# Patient Record
Sex: Male | Born: 1965 | Race: Black or African American | Hispanic: No | Marital: Married | Smoking: Light tobacco smoker
Health system: Southern US, Community
[De-identification: ages and names within clinical notes are randomized; demographics above are authoritative.]

## PROBLEM LIST (undated history)

## (undated) DIAGNOSIS — E119 Type 2 diabetes mellitus without complications: Secondary | ICD-10-CM

## (undated) DIAGNOSIS — I10 Essential (primary) hypertension: Secondary | ICD-10-CM

---

## 2001-11-15 ENCOUNTER — Emergency Department (HOSPITAL_COMMUNITY): Admission: EM | Admit: 2001-11-15 | Discharge: 2001-11-15 | Payer: Self-pay | Admitting: Emergency Medicine

## 2015-10-25 ENCOUNTER — Emergency Department (HOSPITAL_COMMUNITY): Payer: Self-pay

## 2015-10-25 ENCOUNTER — Encounter (HOSPITAL_COMMUNITY): Payer: Self-pay | Admitting: Emergency Medicine

## 2015-10-25 ENCOUNTER — Emergency Department (HOSPITAL_COMMUNITY)
Admission: EM | Admit: 2015-10-25 | Discharge: 2015-10-26 | Disposition: A | Discharge status: Home / Self Care | Payer: Self-pay | Attending: Emergency Medicine | Admitting: Emergency Medicine

## 2015-10-25 DIAGNOSIS — Z7984 Long term (current) use of oral hypoglycemic drugs: Secondary | ICD-10-CM | POA: Insufficient documentation

## 2015-10-25 DIAGNOSIS — F17210 Nicotine dependence, cigarettes, uncomplicated: Secondary | ICD-10-CM | POA: Insufficient documentation

## 2015-10-25 DIAGNOSIS — E860 Dehydration: Secondary | ICD-10-CM | POA: Insufficient documentation

## 2015-10-25 DIAGNOSIS — E119 Type 2 diabetes mellitus without complications: Secondary | ICD-10-CM | POA: Insufficient documentation

## 2015-10-25 DIAGNOSIS — I10 Essential (primary) hypertension: Secondary | ICD-10-CM | POA: Insufficient documentation

## 2015-10-25 DIAGNOSIS — R55 Syncope and collapse: Secondary | ICD-10-CM | POA: Insufficient documentation

## 2015-10-25 HISTORY — DX: Type 2 diabetes mellitus without complications: E11.9

## 2015-10-25 HISTORY — DX: Essential (primary) hypertension: I10

## 2015-10-25 LAB — URINALYSIS, ROUTINE W REFLEX MICROSCOPIC
Bilirubin Urine: NEGATIVE
Glucose, UA: NEGATIVE mg/dL
Ketones, ur: 15 mg/dL — AB
LEUKOCYTES UA: NEGATIVE
NITRITE: NEGATIVE
PH: 5 (ref 5.0–8.0)
Protein, ur: NEGATIVE mg/dL
SPECIFIC GRAVITY, URINE: 1.015 (ref 1.005–1.030)

## 2015-10-25 LAB — CBC
HEMATOCRIT: 38.8 % — AB (ref 39.0–52.0)
HEMOGLOBIN: 12.1 g/dL — AB (ref 13.0–17.0)
MCH: 28.5 pg (ref 26.0–34.0)
MCHC: 31.2 g/dL (ref 30.0–36.0)
MCV: 91.5 fL (ref 78.0–100.0)
Platelets: 200 10*3/uL (ref 150–400)
RBC: 4.24 MIL/uL (ref 4.22–5.81)
RDW: 14.1 % (ref 11.5–15.5)
WBC: 5.6 10*3/uL (ref 4.0–10.5)

## 2015-10-25 LAB — BASIC METABOLIC PANEL
ANION GAP: 12 (ref 5–15)
BUN: 5 mg/dL — ABNORMAL LOW (ref 6–20)
CO2: 20 mmol/L — ABNORMAL LOW (ref 22–32)
Calcium: 8.1 mg/dL — ABNORMAL LOW (ref 8.9–10.3)
Chloride: 105 mmol/L (ref 101–111)
Creatinine, Ser: 0.79 mg/dL (ref 0.61–1.24)
Glucose, Bld: 105 mg/dL — ABNORMAL HIGH (ref 65–99)
POTASSIUM: 3.3 mmol/L — AB (ref 3.5–5.1)
SODIUM: 137 mmol/L (ref 135–145)

## 2015-10-25 LAB — URINE MICROSCOPIC-ADD ON
Squamous Epithelial / LPF: NONE SEEN
WBC UA: NONE SEEN WBC/hpf (ref 0–5)

## 2015-10-25 LAB — I-STAT TROPONIN, ED: TROPONIN I, POC: 0 ng/mL (ref 0.00–0.08)

## 2015-10-25 LAB — CBG MONITORING, ED: GLUCOSE-CAPILLARY: 92 mg/dL (ref 65–99)

## 2015-10-25 MED ORDER — TETRACAINE HCL 0.5 % OP SOLN
2.0000 [drp] | Freq: Once | OPHTHALMIC | Status: AC
  Administered 2015-10-26: 2 [drp] via OPHTHALMIC
  Filled 2015-10-25: qty 2

## 2015-10-25 MED ORDER — SODIUM CHLORIDE 0.9 % IV BOLUS (SEPSIS)
1000.0000 mL | Freq: Once | INTRAVENOUS | Status: AC
  Administered 2015-10-25: 1000 mL via INTRAVENOUS

## 2015-10-25 MED ORDER — SODIUM CHLORIDE 0.9 % IV BOLUS (SEPSIS)
1000.0000 mL | Freq: Once | INTRAVENOUS | Status: AC
  Administered 2015-10-26: 1000 mL via INTRAVENOUS

## 2015-10-25 NOTE — ED Notes (Signed)
Pt given urinal and instructed to provide urine sample 

## 2015-10-25 NOTE — ED Notes (Signed)
Patient transported to X-ray 

## 2015-10-25 NOTE — ED Notes (Signed)
Pt [place] roadside with [facility] for syncopal episode that was witnessed by bystanders; EMS reported that patient has been out in heat all day with minimal to eat/drink except 2 beers; pt was found to be hot and diaphoretic, was lethargic and disoriented x4; pt placed in cool ambulance and therapy initiated and pt became more alert and oriented; EMS reports pt recently released from prison and dx with DM and given new Rx for metformin- needs education

## 2015-10-25 NOTE — ED Notes (Signed)
Patient transported to CT 

## 2015-10-25 NOTE — ED Provider Notes (Signed)
CSN: [id number]     Arrival date & time 10/25/15  2142 History   First MD Initiated Contact with Patient 10/25/15 2204     Chief Complaint  Patient presents with  . Loss of Consciousness  . Heat Exposure  . Dizziness  . Blurred Vision  . Nausea     (Consider location/radiation/quality/duration/timing/severity/associated sxs/prior Treatment) The history is provided by the patient.     Pt with recent hx DM on metformin, released from prison two days ago brought in by EMS following episode of syncope.  Pt states he has felt generalized weakness and blurry vision for two days following being released from prison.  States today he had two small cans of beer and was walking home from the store when he began to feel like he was going to pass out, he stopped walking but could not stop it.  Denies any injuries.  Has chronic problems on the right side of his body, pain in his right eye, right lower back pain, numbness in his right foot.    Past Medical History  Diagnosis Date  . Diabetes mellitus without complication (HCC)   . Hypertension    History reviewed. No pertinent past surgical history. History reviewed. No pertinent family history. Social History  Substance Use Topics  . Smoking status: Light Tobacco Smoker    Types: Cigarettes  . Smokeless tobacco: None  . Alcohol Use: Yes     Comment: 2 cans of beer today    Review of Systems  All other systems reviewed and are negative.     Allergies  Shellfish allergy  Home Medications   Prior to Admission medications   Medication Sig Start Date End Date Taking? Authorizing Provider  metFORMIN (GLUCOPHAGE) 500 MG tablet Take 500 mg by mouth 2 (two) times daily with a meal.   Yes Historical Provider, MD   BP 134/76 mmHg  Pulse 90  Temp(Src) 97.4 F (36.3 C) (Oral)  Resp 16  SpO2 97% Physical Exam  Constitutional: He is oriented to person, place, and time. He appears well-developed and well-nourished. No distress.  HENT:   Head: Normocephalic and atraumatic.  Mouth/Throat: Mucous membranes are dry.  Eyes: EOM are normal. Pupils are equal, round, and reactive to light. Right eye exhibits no discharge. Left eye exhibits no discharge.  Right eye pressure 19  Neck: Neck supple.  Cardiovascular: Normal rate and regular rhythm.   Pulmonary/Chest: Effort normal and breath sounds normal. No respiratory distress. He has no wheezes. He has no rales.  Abdominal: Soft. He exhibits no distension. There is no rebound and no guarding.  Obese.  Mild lower abdominal tenderness  Neurological: He is alert and oriented to person, place, and time. He exhibits normal muscle tone. GCS eye subscore is 4. GCS verbal subscore is 5. GCS motor subscore is 6.  CN II-XII intact, EOMs intact, no pronator drift, grip strengths equal bilaterally; strength 5/5 in all extremities, sensation intact in upper extremities and left lower extremity, right lower extremity only to the level of the ankle. Pt denies sensation in right foot; finger to nose is slow but normal.    Skin: He is not diaphoretic.  Nursing note and vitals reviewed.   ED Course  Procedures (including critical care time) Labs Review Labs Reviewed  BASIC METABOLIC PANEL - Abnormal; Notable for the following:    Potassium 3.3 (*)    CO2 20 (*)    Glucose, Bld 105 (*)    BUN 5 (*)  Calcium 8.1 (*)    All other components within normal limits  CBC - Abnormal; Notable for the following:    Hemoglobin 12.1 (*)    HCT 38.8 (*)    All other components within normal limits  URINALYSIS, ROUTINE W REFLEX MICROSCOPIC (NOT [facility]) - Abnormal; Notable for the following:    Hgb urine dipstick TRACE (*)    Ketones, ur 15 (*)    All other components within normal limits  URINE MICROSCOPIC-ADD ON - Abnormal; Notable for the following:    Bacteria, UA RARE (*)    Casts HYALINE CASTS (*)    All other components within normal limits  CBG MONITORING, ED  I-STAT TROPOININ, ED  Rosezena SensorI-STAT  TROPOININ, ED    Imaging Review Dg Chest 2 View  10/25/2015  CLINICAL DATA:  50 year old male with right-sided weakness and syncope EXAM: CHEST  2 VIEW COMPARISON:  None. FINDINGS: The heart size and mediastinal contours are within normal limits. Both lungs are clear. The visualized skeletal structures are unremarkable. IMPRESSION: No active cardiopulmonary disease. Electronically Signed   By: Elgie CollardArash  Radparvar M.D.   On: 10/25/2015 22:59   Ct Head Wo Contrast  10/26/2015  CLINICAL DATA:  50 year old male with weakness and syncope EXAM: CT HEAD WITHOUT CONTRAST TECHNIQUE: Contiguous axial images were obtained from the base of the skull through the vertex without intravenous contrast. COMPARISON:  None. FINDINGS: The ventricles and the sulci are appropriate in size for the patient's age. There is no intracranial hemorrhage. No midline shift or mass effect identified. The gray-white matter differentiation is preserved. The visualized paranasal sinuses and mastoid air cells are well aerated. The calvarium is intact. IMPRESSION: No acute intracranial pathology. Electronically Signed   By: Elgie CollardArash  Radparvar M.D.   On: 10/26/2015 00:17   I have personally reviewed and evaluated these images and lab results as part of my medical decision-making.   EKG Interpretation   Date/Time:  Friday October 25 2015 23:33:24 EDT Ventricular Rate:  77 PR Interval:    QRS Duration: 104 QT Interval:  429 QTC Calculation: 486 R Axis:   33 Text Interpretation:  Sinus rhythm Borderline T wave abnormalities  Borderline ST elevation, anterior leads Borderline prolonged QT interval  No significant change since last tracing Confirmed by NGUYEN, Manisha Cancel  ([id number]) on 10/25/2015 11:56:50 PM       12:31 AM Pt reports he is feeling much better.  He looks much improved, is more energetic, talkative.    MDM   Final diagnoses:  Syncope, unspecified syncope type  Dehydration   Afebrile, nontoxic patient with 2 days of  generalized weakness with episode of syncope after drinking ETOH and then walking in the heat.  Had prodrome of lightheadedness.  Pt dehydrated clinically, HR increases significantly with orthostatic vital sign testing.  Great improvement with IVF and PO food/beverage.  Workup reassuring.  Discussed pt with Dr Cyndie ChimeNguyen.   D/C home with PCP resources.  Discussed result, findings, treatment, and follow up  with patient.  Pt given return precautions.  Pt verbalizes understanding and agrees with plan.         Trixie Dredgemily Milam Allbaugh, PA-C 10/26/15 [id number]  Leta BaptistEmily Roe Nguyen, MD 10/31/15 [phone]

## 2015-10-26 LAB — I-STAT TROPONIN, ED: TROPONIN I, POC: 0.01 ng/mL (ref 0.00–0.08)

## 2015-10-26 MED ORDER — SODIUM CHLORIDE 0.9 % IV BOLUS (SEPSIS)
1000.0000 mL | Freq: Once | INTRAVENOUS | Status: AC
  Administered 2015-10-26: 1000 mL via INTRAVENOUS

## 2015-10-26 NOTE — ED Notes (Signed)
Pt given orange juice.

## 2015-10-26 NOTE — ED Notes (Signed)
Placed back on 2L [place], desating in his sleep to mid 80s

## 2015-10-26 NOTE — ED Notes (Signed)
The pt is c/o being cold blanket given  No other complaints

## 2015-10-26 NOTE — Discharge Instructions (Signed)
Read the information below.  You may return to the Emergency Department at any time for worsening condition or any new symptoms that concern you.   Dehydration, Adult Dehydration is a condition in which you do not have enough fluid or water in your body. It happens when you take in less fluid than you lose. Vital organs such as the kidneys, brain, and heart cannot function without a proper amount of fluids. Any loss of fluids from the body can cause dehydration.  Dehydration can range from mild to severe. This condition should be treated right away to help prevent it from becoming severe. CAUSES  This condition may be caused by:  Vomiting.  Diarrhea.  Excessive sweating, such as when exercising in hot or humid weather.  Not drinking enough fluid during strenuous exercise or during an illness.  Excessive urine output.  Fever.  Certain medicines. RISK FACTORS This condition is more likely to develop in:  People who are taking certain medicines that cause the body to lose excess fluid (diuretics).   People who have a chronic illness, such as diabetes, that may increase urination.  Older adults.   People who live at high altitudes.   People who participate in endurance sports.  SYMPTOMS  Mild Dehydration  Thirst.  Dry lips.  Slightly dry mouth.  Dry, warm skin. Moderate Dehydration  Very dry mouth.   Muscle cramps.   Dark urine and decreased urine production.   Decreased tear production.   Headache.   Light-headedness, especially when you stand up from a sitting position.  Severe Dehydration  Changes in skin.   Cold and clammy skin.   Skin does not spring back quickly when lightly pinched and released.   Changes in body fluids.   Extreme thirst.   No tears.   Not able to sweat when body temperature is high, such as in hot weather.   Minimal urine production.   Changes in vital signs.   Rapid, weak pulse (more than 100 beats  per minute when you are sitting still).   Rapid breathing.   Low blood pressure.   Other changes.   Sunken eyes.   Cold hands and feet.   Confusion.  Lethargy and difficulty being awakened.  Fainting (syncope).   Short-term weight loss.   Unconsciousness. DIAGNOSIS  This condition may be diagnosed based on your symptoms. You may also have tests to determine how severe your dehydration is. These tests may include:   Urine tests.   Blood tests.  TREATMENT  Treatment for this condition depends on the severity. Mild or moderate dehydration can often be treated at home. Treatment should be started right away. Do not wait until dehydration becomes severe. Severe dehydration needs to be treated at the hospital. Treatment for Mild Dehydration  Drinking plenty of water to replace the fluid you have lost.   Replacing minerals in your blood (electrolytes) that you may have lost.  Treatment for Moderate Dehydration  Consuming oral rehydration solution (ORS). Treatment for Severe Dehydration  Receiving fluid through an IV tube.   Receiving electrolyte solution through a feeding tube that is passed through your nose and into your stomach (nasogastric tube or NG tube).  Correcting any abnormalities in electrolytes. HOME CARE INSTRUCTIONS   Drink enough fluid to keep your urine clear or pale yellow.   Drink water or fluid slowly by taking small sips. You can also try sucking on ice cubes.  Have food or beverages that contain electrolytes. Examples include bananas and  sports drinks.  Take over-the-counter and prescription medicines only as told by your health care provider.   Prepare ORS according to the manufacturer's instructions. Take sips of ORS every 5 minutes until your urine returns to normal.  If you have vomiting or diarrhea, continue to try to drink water, ORS, or both.   If you have diarrhea, avoid:   Beverages that contain caffeine.   Fruit  juice.   Milk.   Carbonated soft drinks.  Do not take salt tablets. This can lead to the condition of having too much sodium in your body (hypernatremia).  SEEK MEDICAL CARE IF:  You cannot eat or drink without vomiting.  You have had moderate diarrhea during a period of more than 24 hours.  You have a fever. SEEK IMMEDIATE MEDICAL CARE IF:   You have extreme thirst.  You have severe diarrhea.  You have not urinated in 6-8 hours, or you have urinated only a small amount of very dark urine.  You have shriveled skin.  You are dizzy, confused, or both.   This information is not intended to replace advice given to you by your health care provider. Make sure you discuss any questions you have with your health care provider.   Document Released: 04/13/2005 Document Revised: 01/02/2015 Document Reviewed: 08/29/2014 Elsevier Interactive Patient Education 2016 [employer].  Syncope Syncope is a medical term for fainting or passing out. This means you lose consciousness and drop to the ground. People are generally unconscious for less than 5 minutes. You may have some muscle twitches for up to 15 seconds before waking up and returning to normal. Syncope occurs more often in older adults, but it can happen to anyone. While most causes of syncope are not dangerous, syncope can be a sign of a serious medical problem. It is important to seek medical care.  CAUSES  Syncope is caused by a sudden drop in blood flow to the brain. The specific cause is often not determined. Factors that can bring on syncope include:  Taking medicines that lower blood pressure.  Sudden changes in posture, such as standing up quickly.  Taking more medicine than prescribed.  Standing in one place for too long.  Seizure disorders.  Dehydration and excessive exposure to heat.  Low blood sugar (hypoglycemia).  Straining to have a bowel movement.  Heart disease, irregular heartbeat, or other  circulatory problems.  Fear, emotional distress, seeing blood, or severe pain. SYMPTOMS  Right before fainting, you may:  Feel dizzy or light-headed.  Feel nauseous.  See all white or all black in your field of vision.  Have cold, clammy skin. DIAGNOSIS  Your health care provider will ask about your symptoms, perform a physical exam, and perform an electrocardiogram (ECG) to record the electrical activity of your heart. Your health care provider may also perform other heart or blood tests to determine the cause of your syncope which may include:  Transthoracic echocardiogram (TTE). During echocardiography, sound waves are used to evaluate how blood flows through your heart.  Transesophageal echocardiogram (TEE).  Cardiac monitoring. This allows your health care provider to monitor your heart rate and rhythm in real time.  Holter monitor. This is a portable device that records your heartbeat and can help diagnose heart arrhythmias. It allows your health care provider to track your heart activity for several days, if needed.  Stress tests by exercise or by giving medicine that makes the heart beat faster. TREATMENT  In most cases, no treatment is needed.  Depending on the cause of your syncope, your health care provider may recommend changing or stopping some of your medicines. HOME CARE INSTRUCTIONS  Have someone stay with you until you feel stable.  Do not drive, use machinery, or play sports until your health care provider says it is okay.  Keep all follow-up appointments as directed by your health care provider.  Lie down right away if you start feeling like you might faint. Breathe deeply and steadily. Wait until all the symptoms have passed.  Drink enough fluids to keep your urine clear or pale yellow.  If you are taking blood pressure or heart medicine, get up slowly and take several minutes to sit and then stand. This can reduce dizziness. SEEK IMMEDIATE MEDICAL CARE IF:     You have a severe headache.  You have unusual pain in the chest, abdomen, or back.  You are bleeding from your mouth or rectum, or you have black or tarry stool.  You have an irregular or very fast heartbeat.  You have pain with breathing.  You have repeated fainting or seizure-like jerking during an episode.  You faint when sitting or lying down.  You have confusion.  You have trouble walking.  You have severe weakness.  You have vision problems. If you fainted, call your local emergency services (911 in [place] Do not drive yourself to the hospital.    This information is not intended to replace advice given to you by your health care provider. Make sure you discuss any questions you have with your health care provider.   Document Released: 04/13/2005 Document Revised: 08/28/2014 Document Reviewed: 06/12/2011 Elsevier Interactive Patient Education 2016 [employer]   [employer] Resource Guide Financial Assistance The United Ways 211 is a great source of information about community services available.  Access by dialing 2-1-1 from anywhere in [place], or by website -  PooledIncome.plwww.nc211.org.   Other Local Resources (Updated 04/2015)  Financial Assistance   Services    Phone Number and Address  [facility]  Low-cost medical care - 1st and 3rd Saturday of every month  Must not qualify for public or private insurance and must have limited income [phone] [address] [place], [place]    [place] [employer] Social Services  Child care  Emergency assistance for housing and Kimberly[employer]Clarkutilities  Food stamps  Medicaid [phone] [address] [place], [place] [address]   [facility]  Low-cost medical care for children, communicable diseases, sexually-transmitted diseases, immunizations, maternity care, womens health and family planning [phone] [place], [place] [address]   [facility]   Medication assistance for [facility] [place] residents  Must meet income requirements [phone] [address] [place], [place].    [facility]  Child care  Emergency assistance for housing and Kimberly-Clarkutilities  Food stamps  Medicaid [phone] [address] [place], [place] [address]  [facility]   Low-cost medical care,   Monday through Friday, 9 am to 6 pm.   Accepts Medicare/Medicaid, and self-pay [phone] [address] [place], [place] [address]  [facility]  Low-cost medical care - Monday through Friday, 8:30 am - 5:30 pm  Accepts Medicaid and self-pay [phone] [address] [place], [place] [address]   [facility]  Primary medical care, including for those with sickle cell disease  Accepts Medicare, Medicaid, insurance and self-pay [phone] [address] [place], [place]  [facility]   Primary medical care  Accepts Medicare, [place], insurance and self-pay [phone] [address] [place], [place] [address]   [facility] Department of Social Services  Child care  Emergency assistance for housing and Kimberly-Clarkutilities  Food stamps  Medicaid [phone] [address] [place], [place] [address]  [facility] and [employer]Human Services  Child care  Emergency assistance for housing and Kimberly-Clarkutilities  Food stamps  Medicaid [phone] [address] [place], [place] [address]   [facility]  Medication assistance for [facility] residents with no insurance only  Must have a primary care doctor [phone] [address] [place], [place]  [facility]   Primary medical care  [place] Medicare, [place], insurance  [phone] [address] [place], [place]  [facility]   Medication assistance [phone]  [facility]   Primary medical care  Accepts Medicare, [place], insurance and self-pay [phone] [address] [place], [place] [address]  [facility]   Primary medical care  Accepts Medicare, [place], insurance and self-pay [phone] [address] [place], [place] [address]  [facility]  For [place] residents between the ages of 6418 and 2864 who do not have any form of health insurance, Medicare, [place], or [place] benefits.  Services are provided free of charge to uninsured patients who fall within federal poverty guidelines.    Hours: Tuesdays and Thursdays, 4:15 - 8 pm [phone] [address], [address] [place], [place] [address]  [facility]     Primary medical care  Dental care  Nutritional counseling  Pharmacy  Accepts Medicaid, Medicare, most insurance.  Fees are adjusted based on ability to pay.   [phone] [facility] [address] [place], [place]  [phone] [facility] [address] [place], [place]  [phone] [facility] [place], [place]  [phone] [facility], [address] [place], [place]  [phone] [facility] [address] [place], [place]  [facility]  Womens health and family planning [phone] [address] [place], [place]  [facility] Department of Social Services  Child care  Emergency assistance for housing and Kimberly-Clarkutilities  Food stamps  Medicaid [phone] [address], [place], [place] [address]   [facility]    Ages 3018 and older  Hours: Mondays and Thursdays, 7:00 am - 9:00 am Patients are seen on a first come, first served basis. [phone], [phone] [place], [place]  [facility]  Division of Social Services  Child care  Emergency assistance for housing and Kimberly-Clarkutilities  Food stamps  Medicaid [phone] [address] [place], [place] [address]  [facility] [employer]  Medication assistance  Rental assistance  Food pantry  Medication assistance  Housing assistance  Emergency food distribution  Utility assistance [phone] [address] [place], [place]  [phone]  [address] [place], [place] [address] Hours: Tuesdays and Thursdays from 9am - 12 noon by appointment only  [phone] [address] [place], [place] [address]  [facility] - Lanae Boastlara F. Gunn   Accepts private insurance, [place]icare, and [place]icaid.  Payment is based on a sliding scale for those without insurance.  Hours: Mondays, Tuesdays and Thursdays, 8:30 am - 5:30 pm.   [phone] [address] [place], [place]  [facility] - Family Medicine at [facility], [place], and [place].  Payment is based on a sliding scale for those without insurance. [phone] [address] [place], [place]  [facility] - Pediatrics at E. Scientist, research (physical sciences), [employer], and [place].  Payment is based on a sliding scale for those without insurance [phone] [address], [employer], [place]  [facility] - Pediatrics at [employer], [place], and [place].  Payment is based on a sliding scale for those without insurance. [phone] [address] [place], [place]  [facility] - Pediatrics at [facility], [place], and [place].  Payment is based on a sliding scale for those without insurance. [phone], [phone] [place], [place].    [facility]  Maternity care.  Accepts Medicaid and self-pay. [phone] [address] [place], [place]

## 2018-03-14 IMAGING — CT CT HEAD W/O CM
3 series · 15 of 47 positions shown, 18 images · non-contrast
Comparison: None.

CLINICAL DATA: 50-year-old male with weakness and syncope

EXAM:
CT HEAD WITHOUT CONTRAST
TECHNIQUE: Contiguous axial images were obtained from the base of the skull
through the vertex without intravenous contrast.

[Series 2: head 5.0 h30s · axial · 0.46mm/px · z∈[-155,-15]mm · 9 of 34 slices shown, 12 images]
[im 3/34  brain]
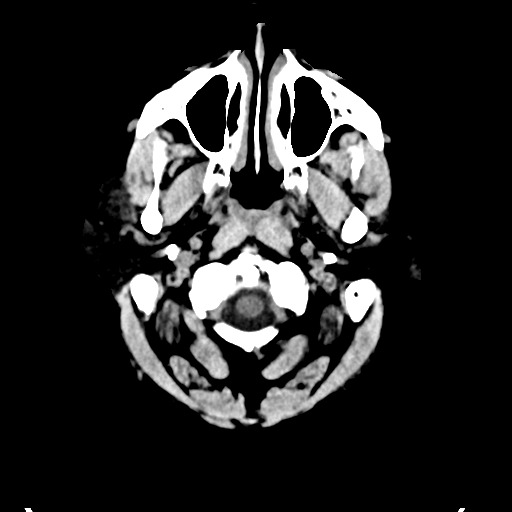
[im 3/34  bone]
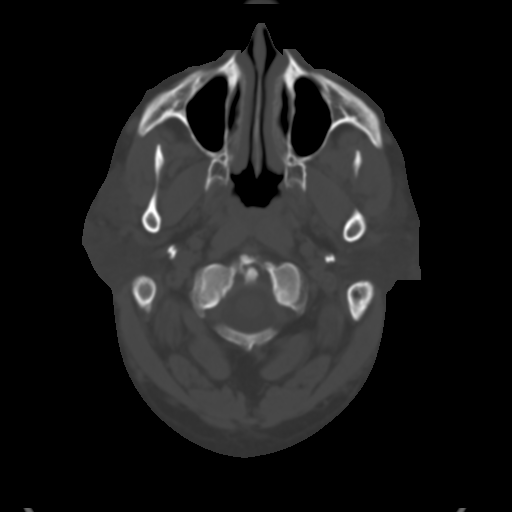
[im 6/34  brain]
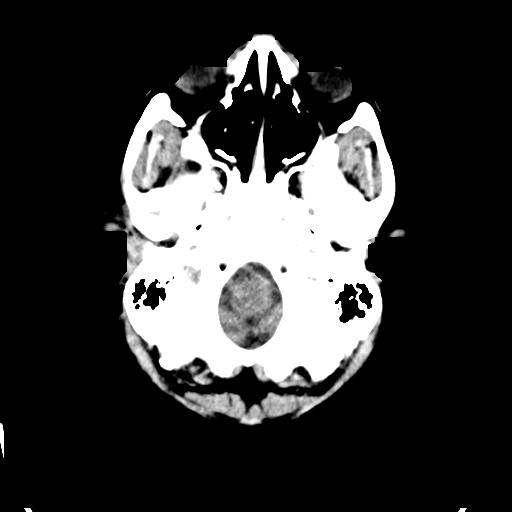
[im 10/34  brain]
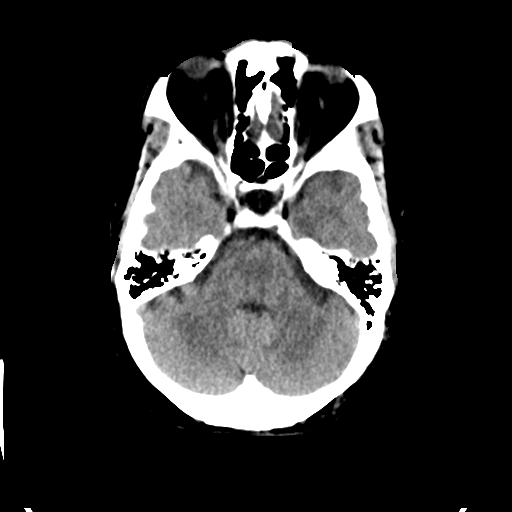
[im 13/34  brain]
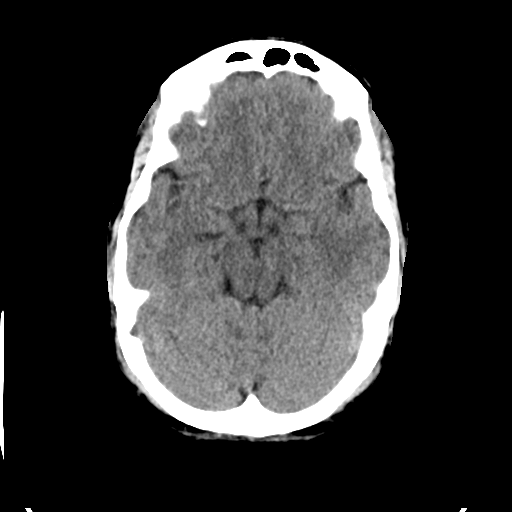
[im 18/34  brain]
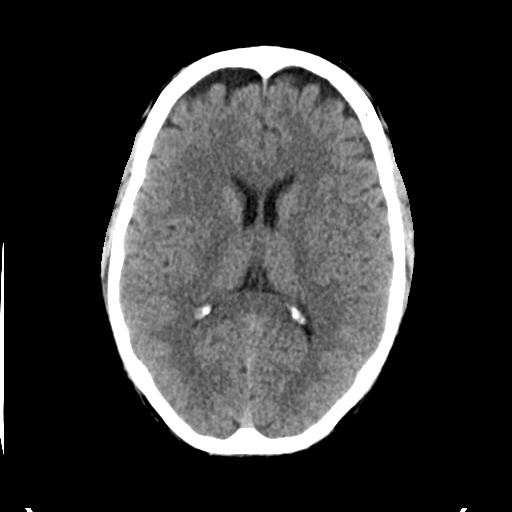
[im 18/34  bone]
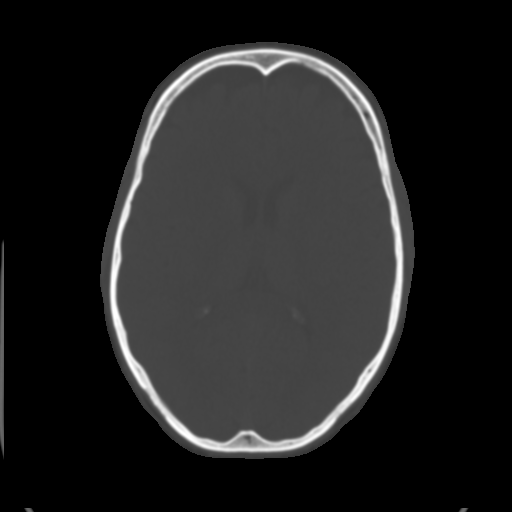
[im 21/34  brain]
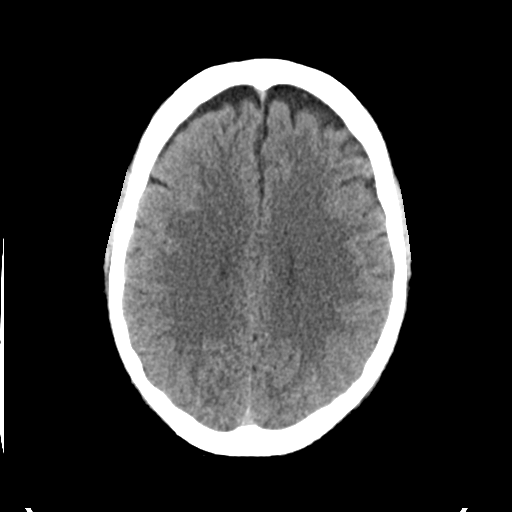
[im 24/34  brain]
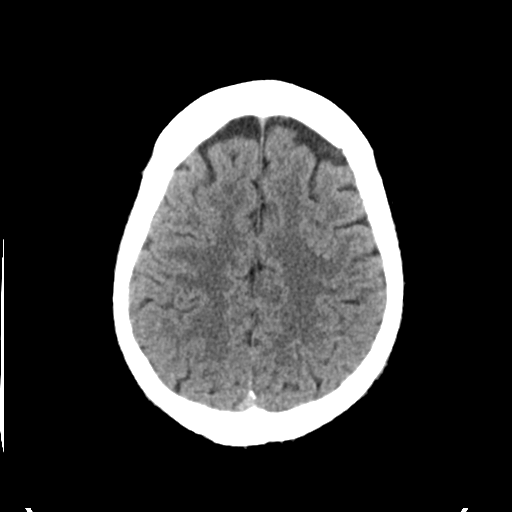
[im 28/34  brain]
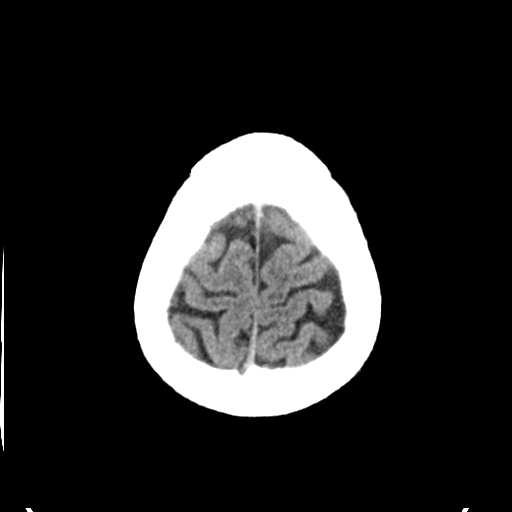
[im 31/34  brain]
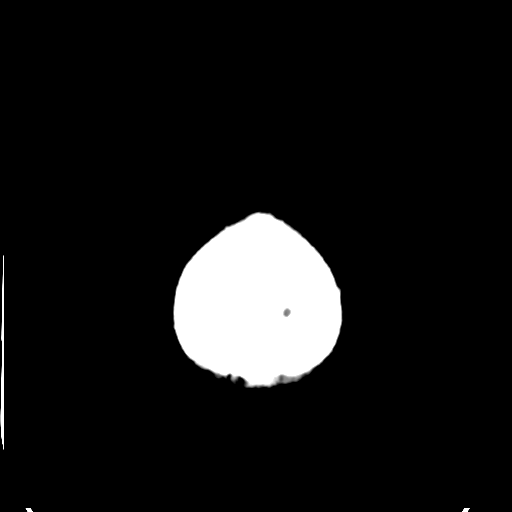
[im 31/34  bone]
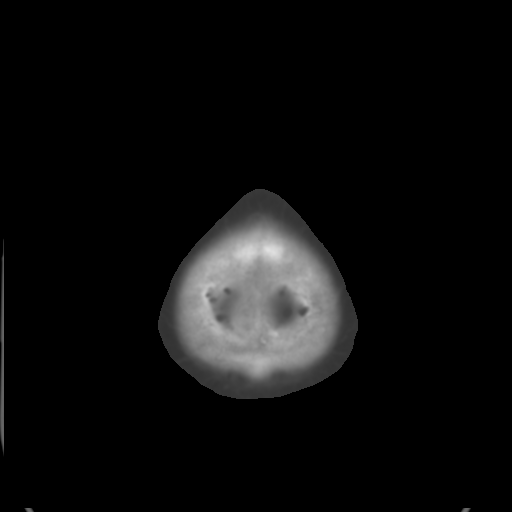

[Series 4: head 3.0 mpr · coronal · 0.30mm/px · 3 of 68 slices shown (1 of 2)]
[im 23/68  brain]
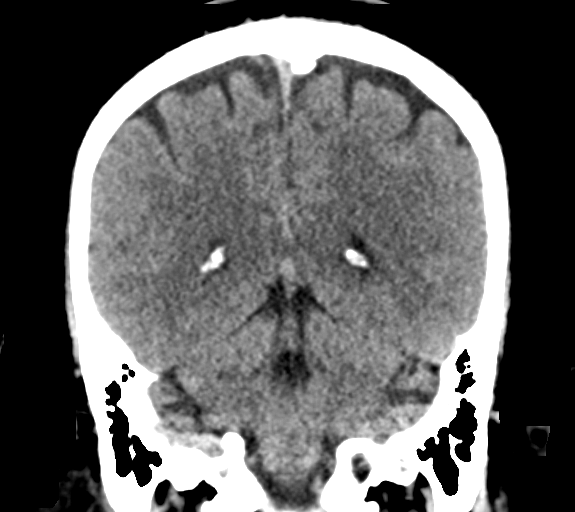
[im 30/68  brain]
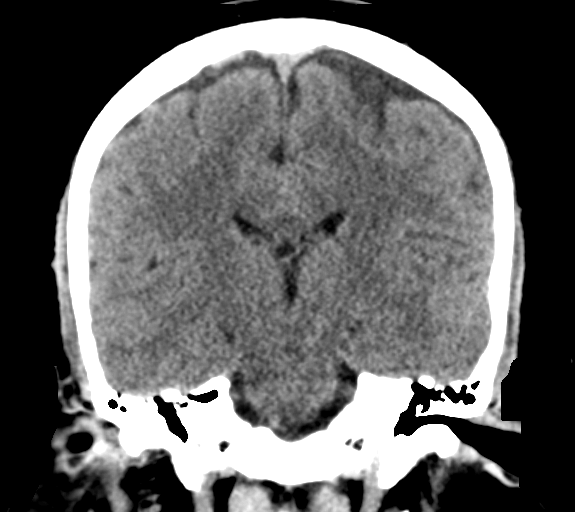
[im 38/68  brain]
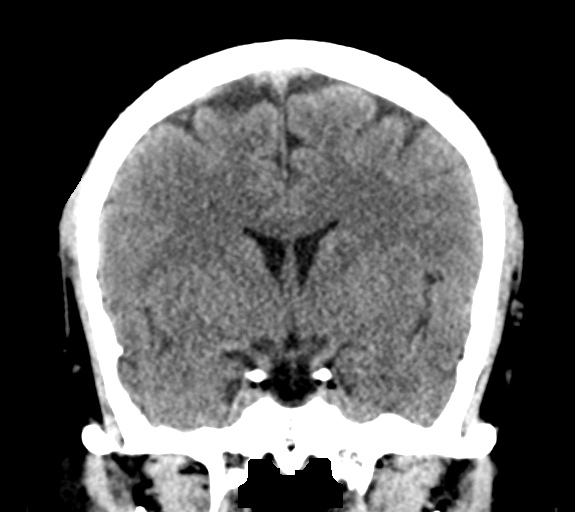

[Series 5: head 3.0 mpr · sagittal · 0.34mm/px · 3 of 58 slices shown (2 of 2)]
[im 20/58  brain]
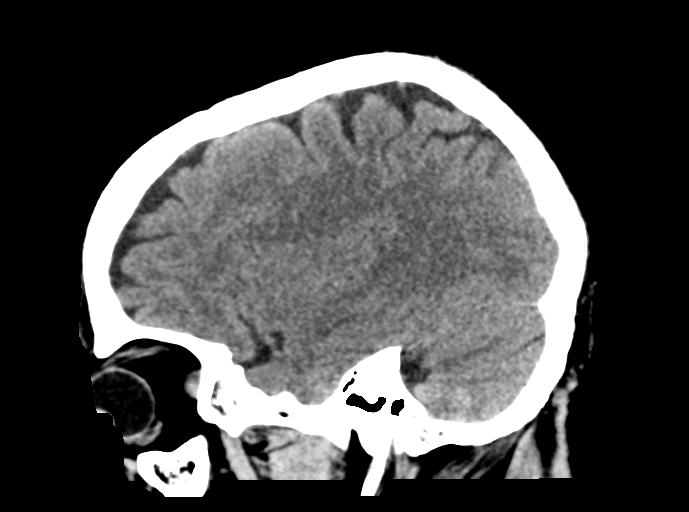
[im 29/58  brain]
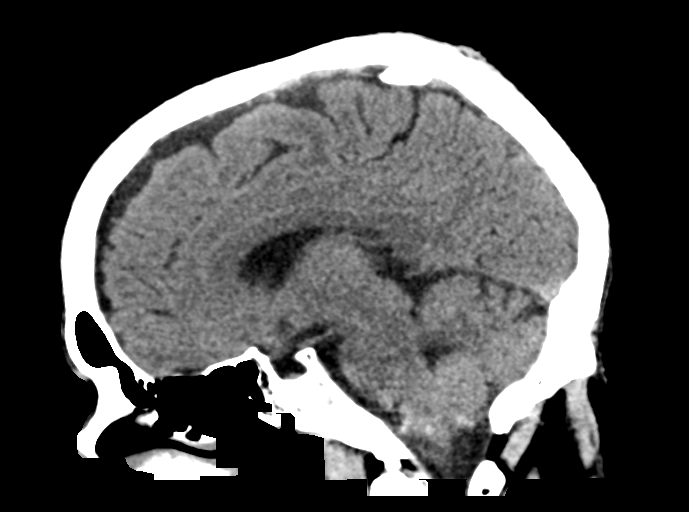
[im 39/58  brain]
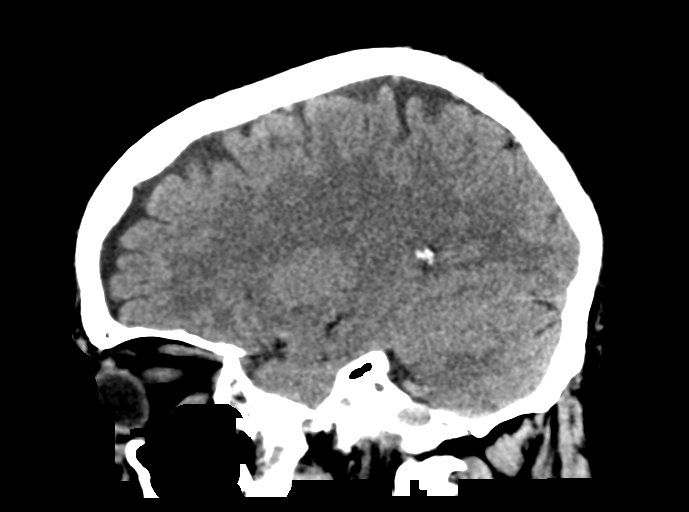

[15 of 47 positions shown; findings below may reference images not displayed]

FINDINGS: The ventricles and the sulci are appropriate in size for the
patient's age. There is no intracranial hemorrhage. No midline shift
or mass effect identified. The gray-white matter differentiation is
preserved.

The visualized paranasal sinuses and mastoid air cells are well
aerated. The calvarium is intact.
IMPRESSION: No acute intracranial pathology.

## 2022-05-31 ENCOUNTER — Ambulatory Visit (HOSPITAL_COMMUNITY)
Admission: EM | Admit: 2022-05-31 | Discharge: 2022-06-01 | Disposition: A | Discharge status: Home / Self Care | Payer: Medicaid Other | Attending: Nurse Practitioner | Admitting: Nurse Practitioner

## 2022-05-31 DIAGNOSIS — Z79899 Other long term (current) drug therapy: Secondary | ICD-10-CM | POA: Diagnosis not present

## 2022-05-31 DIAGNOSIS — Z556 Problems related to health literacy: Secondary | ICD-10-CM | POA: Insufficient documentation

## 2022-05-31 DIAGNOSIS — R454 Irritability and anger: Secondary | ICD-10-CM | POA: Insufficient documentation

## 2022-05-31 DIAGNOSIS — F419 Anxiety disorder, unspecified: Secondary | ICD-10-CM | POA: Insufficient documentation

## 2022-05-31 DIAGNOSIS — Z7984 Long term (current) use of oral hypoglycemic drugs: Secondary | ICD-10-CM | POA: Insufficient documentation

## 2022-05-31 DIAGNOSIS — Z560 Unemployment, unspecified: Secondary | ICD-10-CM | POA: Insufficient documentation

## 2022-05-31 DIAGNOSIS — F331 Major depressive disorder, recurrent, moderate: Secondary | ICD-10-CM | POA: Diagnosis not present

## 2022-05-31 DIAGNOSIS — Z5986 Financial insecurity: Secondary | ICD-10-CM | POA: Diagnosis not present

## 2022-05-31 DIAGNOSIS — Z1152 Encounter for screening for COVID-19: Secondary | ICD-10-CM | POA: Diagnosis not present

## 2022-05-31 DIAGNOSIS — E119 Type 2 diabetes mellitus without complications: Secondary | ICD-10-CM | POA: Diagnosis not present

## 2022-05-31 DIAGNOSIS — R441 Visual hallucinations: Secondary | ICD-10-CM | POA: Insufficient documentation

## 2022-05-31 LAB — RESP PANEL BY RT-PCR (RSV, FLU A&B, COVID)  RVPGX2
Influenza A by PCR: NEGATIVE
Influenza B by PCR: NEGATIVE
Resp Syncytial Virus by PCR: NEGATIVE
SARS Coronavirus 2 by RT PCR: NEGATIVE

## 2022-05-31 LAB — CBC WITH DIFFERENTIAL/PLATELET
Abs Immature Granulocytes: 0.01 10*3/uL (ref 0.00–0.07)
Basophils Absolute: 0 10*3/uL (ref 0.0–0.1)
Basophils Relative: 0 %
Eosinophils Absolute: 0 10*3/uL (ref 0.0–0.5)
Eosinophils Relative: 1 %
HCT: 42 % (ref 39.0–52.0)
Hemoglobin: 13.7 g/dL (ref 13.0–17.0)
Immature Granulocytes: 0 %
Lymphocytes Relative: 33 %
Lymphs Abs: 1.9 10*3/uL (ref 0.7–4.0)
MCH: 29.8 pg (ref 26.0–34.0)
MCHC: 32.6 g/dL (ref 30.0–36.0)
MCV: 91.3 fL (ref 80.0–100.0)
Monocytes Absolute: 0.6 10*3/uL (ref 0.1–1.0)
Monocytes Relative: 10 %
Neutro Abs: 3.1 10*3/uL (ref 1.7–7.7)
Neutrophils Relative %: 56 %
Platelets: 184 10*3/uL (ref 150–400)
RBC: 4.6 MIL/uL (ref 4.22–5.81)
RDW: 14.6 % (ref 11.5–15.5)
WBC: 5.6 10*3/uL (ref 4.0–10.5)
nRBC: 0 % (ref 0.0–0.2)

## 2022-05-31 LAB — COMPREHENSIVE METABOLIC PANEL
ALT: 28 U/L (ref 0–44)
AST: 23 U/L (ref 15–41)
Albumin: 3.8 g/dL (ref 3.5–5.0)
Alkaline Phosphatase: 83 U/L (ref 38–126)
Anion gap: 16 — ABNORMAL HIGH (ref 5–15)
BUN: 12 mg/dL (ref 6–20)
CO2: 24 mmol/L (ref 22–32)
Calcium: 9.2 mg/dL (ref 8.9–10.3)
Chloride: 98 mmol/L (ref 98–111)
Creatinine, Ser: 0.74 mg/dL (ref 0.61–1.24)
GFR, Estimated: >60 mL/min (ref >60)
Glucose, Bld: 110 mg/dL — ABNORMAL HIGH (ref 70–99)
Potassium: 3.7 mmol/L (ref 3.5–5.1)
Sodium: 138 mmol/L (ref 135–145)
Total Bilirubin: 0.5 mg/dL (ref 0.3–1.2)
Total Protein: 7.8 g/dL (ref 6.5–8.1)

## 2022-05-31 LAB — POCT URINE DRUG SCREEN - MANUAL ENTRY (I-SCREEN)
POC Amphetamine UR: NOT DETECTED
POC Buprenorphine (BUP): NOT DETECTED
POC Cocaine UR: NOT DETECTED
POC Marijuana UR: NOT DETECTED
POC Methadone UR: NOT DETECTED
POC Methamphetamine UR: NOT DETECTED
POC Morphine: NOT DETECTED
POC Oxazepam (BZO): NOT DETECTED
POC Oxycodone UR: NOT DETECTED
POC Secobarbital (BAR): NOT DETECTED

## 2022-05-31 LAB — ETHANOL: Alcohol, Ethyl (B): 174 mg/dL — ABNORMAL HIGH (ref <10)

## 2022-05-31 MED ORDER — MAGNESIUM HYDROXIDE 400 MG/5ML PO SUSP: 30.0000 mL | Freq: Every day | ORAL | Status: DC | PRN

## 2022-05-31 MED ORDER — TRAZODONE HCL 50 MG PO TABS
50.0000 mg | ORAL_TABLET | Freq: Every evening | ORAL | Status: DC | PRN
  Administered 2022-05-31: 50 mg via ORAL
  Filled 2022-05-31: qty 1

## 2022-05-31 MED ORDER — HYDROXYZINE HCL 25 MG PO TABS
25.0000 mg | ORAL_TABLET | Freq: Three times a day (TID) | ORAL | Status: DC | PRN
  Administered 2022-05-31: 25 mg via ORAL
  Filled 2022-05-31: qty 1

## 2022-05-31 MED ORDER — ALUM & MAG HYDROXIDE-SIMETH 200-200-20 MG/5ML PO SUSP: 30.0000 mL | ORAL | Status: DC | PRN

## 2022-05-31 MED ORDER — SERTRALINE HCL 25 MG PO TABS
25.0000 mg | ORAL_TABLET | Freq: Every day | ORAL | Status: DC
  Administered 2022-05-31 – 2022-06-01 (×2): 25 mg via ORAL
  Filled 2022-05-31 (×2): qty 1

## 2022-05-31 MED ORDER — ACETAMINOPHEN 325 MG PO TABS: 650.0000 mg | ORAL_TABLET | Freq: Four times a day (QID) | ORAL | Status: DC | PRN
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
## 2022-05-31 NOTE — Progress Notes (Signed)
   05/31/22 2040  [facility] Triage Screening (Walk-ins at [facility] only)  How Did You Hear About [place]? Family/Friend  What Is the Reason for Your Visit/Call Today? Pt reports diagnosis of PTSD and GAD. He has been incarcerated several times since age 57 and was released from prison 9 months ago after 13 years in prison. He describes severe anxiety and panic attacks. He says he "self-medicates" with alcohol but it does not help. He reports sleeping 1-2 hours per night. He has crying spells and depressive symptoms. He reports visual hallucinations of shadows. He denies current suicidal ideation or homicidal ideation. He denies use of substances other than alcohol.  How Long Has This Been Causing You Problems? > than 6 months  Have You Recently Had Any Thoughts About Hurting Yourself? No  Are You Planning to Commit Suicide/Harm Yourself At This time? No  Have you Recently Had Thoughts About [place]? No  Are You Planning To Harm Someone At This Time? No  Are you currently experiencing any auditory, visual or other hallucinations? No  Have You Used Any Alcohol or Drugs in the Past 24 Hours? Yes  How long ago did you use Drugs or Alcohol? Today  What Did You Use and How Much? 2 cans of beer  Do you have any current medical co-morbidities that require immediate attention? Yes  Please describe current medical co-morbidities that require immediate attention: Pt reports he has untreated diabetes  Clinician description of patient physical appearance/behavior: Pt is casually dressed, alert and oriented x4. Pt speaks in a clear tone, at moderate volume and normal pace. Motor behavior appears normal. Eye contact is good and he is tearful. Pt's mood is anxious and depressed, affect is congruent with mood. Thought process is coherent and relevant. There is no indication he is currently responding to internal stimuli or experiencing delusional thought content. He is cooperative.  What Do You Feel Would Help You the  Most Today? Treatment for Depression or other mood problem;Medication(s)  If access to [facility] was not available, would you have sought care in the Emergency Department? No  Determination of Need Routine (7 days)  Options For Referral [facility];Outpatient Therapy;Medication Management;Facility-Based Crisis
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
## 2022-06-01 MED ORDER — TRAZODONE HCL 50 MG PO TABS: 50.0000 mg | ORAL_TABLET | Freq: Every evening | ORAL | 0 refills | Status: DC | PRN

## 2022-06-01 MED ORDER — SERTRALINE HCL 50 MG PO TABS: 25.0000 mg | ORAL_TABLET | Freq: Every day | ORAL | 0 refills | Status: DC

## 2022-06-01 MED ORDER — HYDROXYZINE HCL 25 MG PO TABS: 25.0000 mg | ORAL_TABLET | Freq: Three times a day (TID) | ORAL | 0 refills | Status: AC | PRN

## 2022-06-01 NOTE — ED Notes (Signed)
Pt sleeping in recliner bed. RR even and unlabored. Will continue to monitor for safety 

## 2022-06-01 NOTE — ED Notes (Signed)
Pt sleeping@this time. Breathing even and unlabored. Will continue to monitor for safety 

## 2022-06-01 NOTE — ED Notes (Signed)
Patient admitted to [facility] experiencing symptoms of PTSD. Patient was cooperative during the admission assessment. Skin assessment complete. Belongings inventoried. Patient oriented to unit and unit rules. Meal and drinks offered to patient. Patient verbalized agreement to treatment plans. Patient verbally contracts for safety during hospitalization. Will continue to monitor for safety.

## 2022-06-01 NOTE — Discharge Instructions (Signed)
Take all medications as prescribed. Keep all follow-up appointments as scheduled.  Do not consume alcohol or use illegal drugs while on prescription medications. Report any adverse effects from your medications to your primary care provider promptly.  In the event of recurrent symptoms or worsening symptoms, call 911, a crisis hotline, or go to the nearest emergency department for evaluation.   
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
## 2022-06-01 NOTE — ED Notes (Signed)
Patient A&O x 4, ambulatory. Patient discharged in no acute distress. Patient denied SI/HI, A/VH upon discharge. Patient verbalized understanding of all discharge instructions explained by staff, to include follow up appointments, RX's and safety plan. Patient reported mood 10/10.  Pt belongings returned to patient from locker # 30 intact. Patient escorted to lobby via staff for transport to destination. Safety maintained.

## 2022-06-27 ENCOUNTER — Ambulatory Visit (HOSPITAL_COMMUNITY)
Admission: EM | Admit: 2022-06-27 | Discharge: 2022-06-27 | Disposition: A | Discharge status: Home / Self Care | Payer: Medicaid Other | Attending: Family | Admitting: Family

## 2022-06-27 ENCOUNTER — Encounter (HOSPITAL_COMMUNITY): Payer: Self-pay | Admitting: Psychiatry

## 2022-06-27 DIAGNOSIS — F419 Anxiety disorder, unspecified: Secondary | ICD-10-CM | POA: Insufficient documentation

## 2022-06-27 DIAGNOSIS — G4709 Other insomnia: Secondary | ICD-10-CM | POA: Insufficient documentation

## 2022-06-27 DIAGNOSIS — F1090 Alcohol use, unspecified, uncomplicated: Secondary | ICD-10-CM | POA: Insufficient documentation

## 2022-06-27 DIAGNOSIS — Z79899 Other long term (current) drug therapy: Secondary | ICD-10-CM | POA: Insufficient documentation

## 2022-06-27 DIAGNOSIS — Z760 Encounter for issue of repeat prescription: Secondary | ICD-10-CM | POA: Insufficient documentation

## 2022-06-27 MED ORDER — TRAZODONE HCL 50 MG PO TABS: 50.0000 mg | ORAL_TABLET | Freq: Every evening | ORAL | 0 refills | Status: DC | PRN

## 2022-06-27 MED ORDER — TRAZODONE HCL 50 MG PO TABS: 50.0000 mg | ORAL_TABLET | Freq: Every evening | ORAL | 0 refills | Status: AC | PRN

## 2022-06-27 NOTE — ED Triage Notes (Signed)
Pt presents to [facility] accompanied by his power of attorney seeking medication management. Pt states he is about to run out of his trazodone and needs a refill. Pt denies SI/HI and AVH.

## 2022-06-27 NOTE — Discharge Instructions (Addendum)
Patient is instructed prior to discharge to:  Take all medications as prescribed by his/her mental healthcare provider. Report any adverse effects and or reactions from the medicines to his/her outpatient provider promptly. Keep all scheduled appointments, to ensure that you are getting refills on time and to avoid any interruption in your medication.  If you are unable to keep an appointment call to reschedule.  Be sure to follow-up with resources and follow-up appointments provided.  Patient has been instructed & cautioned: To not engage in alcohol and or illegal drug use while on prescription medicines. In the event of worsening symptoms, patient is instructed to call the crisis hotline, 911 and or go to the nearest ED for appropriate evaluation and treatment of symptoms. To follow-up with his/her primary care provider for your other medical issues, concerns and or health care needs.  Information: -National Suicide Prevention Lifeline 1-800-SUICIDE or (640)266-6891.  -988 offers 24/7 access to trained crisis counselors who can help people experiencing mental health-related distress. People can call or text 988 or chat 988lifeline.org for themselves or if they are worried about a loved one who may need crisis support.     Outpatient Services for Therapy and Medication Management for [facility] [place], [place], [address] [phone] phone  New Patient Assessment/Therapy Walk-ins Monday and Wednesday: 8am until slots are full. Every 1st and 2nd Friday: 1pm - 5pm  NO ASSESSMENT/THERAPY WALK-INS ON [place]  New Patient Psychiatry/Medication Management Walk-ins Monday-Friday: 8am-11am  For all walk-ins, we ask that you arrive by 7:30am because patient will be seen in the order of arrival.  Availability is limited; therefore, you may not be seen on the same day that you walk-in.  Our goal is to serve and meet the needs of our  community to the best of our ability.   [facility] [address], [place], [place], [address] [phone] phone  [facility] [address] [place], [place], [address] [phone] phone ([address], [place], [place], [place], [place], [employer], [place], [place], [place], [place], [place], [place], [employer], [employer], [employer], [place])  [facility] [address], [place], [place], [address] [phone] phone  [facility] [address] [place], [place], [address] [phone] phone  [facility] [address], [place], [place], [address] [phone] phone  [facility] [address] [place], [address] [phone] phone  [facility] [place] [address], [place], [address] [phone] phone  [facility] [place], [place], [address] [phone] phone [phone] fax  [facility] [address] [place], [place], [address] [phone] phone [phone] fax  [facility] [phone] [place], [place], [address] [phone] phone  [facility] [address] [place], [place], [address]  [phone] phone  [facility]  (Adults Only) [address] [place], [place], [address]  [phone] phone [phone] fax

## 2022-06-27 NOTE — ED Provider Notes (Signed)
Behavioral Health Urgent Care Medical Screening Exam  Patient Name: Seth Hood MRN: [id number] Date of Evaluation: 06/27/22 Chief Complaint:   Diagnosis:  Final diagnoses:  Alcohol use disorder    History of Present illness: Seth Hood is a 57 y.o. male. Patient presents voluntarily to [facility] behavioral health for walk-in assessment.  Patient is accompanied by significant other, [name], prefers that she remain present during assessment.  Patient is assessed, face-to-face, by nurse practitioner, seated in assessment area, no acute distress. Consulted with provider, Dr. Dwyane Dee, and chart reviewed on 06/27/2022. He  is alert and oriented, pleasant and cooperative during assessment.   [name] requests refill for trazodone.  He would like to be linked with outpatient psychiatry for medication management.  He is currently linked with individual talk therapy.  Meets with Dr. Lauretta Chester once per week or once every other week.  Patient was prescribed sertraline approximately 1 month ago.  He discontinued this medication as it "was not good for me, I had a bad reaction."  Patient reports sertraline caused increased anxiety.  Would like to discuss bipolar disorder diagnosis with outpatient psychiatry provider.  Denies history of inpatient psychiatric treatment.  Denies family mental health history.  Patient reports recent stressors include increase in alcohol use.  He typically consumes alcohol daily, 3 twenty-four ounce beers per day.  Most recent alcohol use on yesterday.  Denies history of alcohol-related seizure, denies history of delirium tremens.  Patient refuses facility based crisis or residential substance use treatment options.  He would like to attend AA meetings.  He has successfully completed many months of sobriety via [facility] meeting attendance in the past twice. HE denies substance use aside from alcohol.   Patient  presents with euthymic mood, bright affect. He  denies suicidal  and homicidal ideations. Denies history of suicide attempts, denies history of self-harm.  Patient easily  contracts verbally for safety with this Probation officer.    Patient has normal speech and behavior.  He  denies auditory and visual hallucinations.  Patient is able to converse coherently with goal-directed thoughts and no distractibility or preoccupation.  Denies symptoms of paranoia.  Objectively there is no evidence of psychosis/mania or delusional thinking.  [name] resides in [place] with significant other. He denies access to weapons. He is not currently employed. Patient endorses average sleep and appetite.   Patient and family are educated and verbalize understanding of mental health resources and other crisis services in the community. They are instructed to call 911 and present to the nearest emergency room should patient experience any suicidal/homicidal ideation, auditory/visual/hallucinations, or detrimental worsening of mental health condition.      [name] ED from 06/27/2022 in [facility] ED from 05/31/2022 in [facility] No Risk No Risk       Psychiatric Specialty Exam  Presentation  General Appearance:Appropriate for Environment; Casual  Eye Contact:Good  Speech:Clear and Coherent; Normal Rate  Speech Volume:Normal  Handedness:Right   Mood and Affect  Mood: Euthymic  Affect: Appropriate; Congruent   Thought Process  Thought Processes: Coherent; Goal Directed; Linear  Descriptions of Associations:Intact  Orientation:Full (Time, Place and Person)  Thought Content:Logical; WDL  Diagnosis of Schizophrenia or Schizoaffective disorder in past: No   Hallucinations:None Here voices when he is drinking alcohol  Ideas of Reference:None  Suicidal Thoughts:No  Homicidal Thoughts:No   Sensorium  Memory: Immediate Good; Recent  Good  Judgment: Good  Insight: Radio broadcast assistant  Functions  Concentration: Good  Attention Span: Good  Recall: Good  Fund of Knowledge: Good  Language: Good   Psychomotor Activity  Psychomotor Activity: Normal   Assets  Assets: Desire for Improvement; Communication Skills; Financial Resources/Insurance; Housing; Intimacy; Leisure Time; Physical Health; Resilience; Social Support   Sleep  Sleep: Good  Number of hours:  -1   Physical Exam: Physical Exam Vitals and nursing note reviewed.  Constitutional:      Appearance: Normal appearance. He is well-developed and normal weight.  HENT:     Head: Normocephalic and atraumatic.     Nose: Nose normal.  Cardiovascular:     Rate and Rhythm: Normal rate.  Pulmonary:     Effort: Pulmonary effort is normal.  Musculoskeletal:        General: Normal range of motion.     Cervical back: Normal range of motion.  Skin:    General: Skin is warm and dry.  Neurological:     Mental Status: He is alert and oriented to person, place, and time.  Psychiatric:        Attention and Perception: Attention and perception normal.        Mood and Affect: Mood and affect normal.        Speech: Speech normal.        Behavior: Behavior normal. Behavior is cooperative.        Thought Content: Thought content normal.        Cognition and Memory: Cognition and memory normal.        Judgment: Judgment normal.    Review of Systems  Constitutional: Negative.   HENT: Negative.    Eyes: Negative.   Respiratory: Negative.    Cardiovascular: Negative.   Gastrointestinal: Negative.   Genitourinary: Negative.   Musculoskeletal: Negative.   Skin: Negative.   Neurological: Negative.   Psychiatric/Behavioral:  Positive for substance abuse.    Blood pressure (!) 156/93, pulse 100, temperature 98.1 F (36.7 C), temperature source Oral, resp. rate 18, SpO2 95 %. There is no height or weight on file to calculate  BMI.  Musculoskeletal: Strength & Muscle Tone: within normal limits Gait & Station: normal Patient leans: N/A   [place] [facility] Discharge Disposition for Follow up and Recommendations: Based on my evaluation the patient does not appear to have an emergency medical condition and can be discharged with resources and follow up care in outpatient services for Medication Management and Individual Therapy Follow up with outpatient psychiatry for medication management. Follow up with existing individual counseling provider.  Medication: -hydroxyzine '25mg'$  TID PRN/anxiety -trazodone '50mg'$  QHS PRN/sleep  Lucky Rathke, FNP 06/27/2022, 1:10 PM

## 2022-07-23 DIAGNOSIS — Z6841 Body Mass Index (BMI) 40.0 and over, adult: Secondary | ICD-10-CM | POA: Diagnosis not present

## 2022-07-23 DIAGNOSIS — Z0000 Encounter for general adult medical examination without abnormal findings: Secondary | ICD-10-CM | POA: Diagnosis not present

## 2022-07-23 DIAGNOSIS — Z7189 Other specified counseling: Secondary | ICD-10-CM | POA: Diagnosis not present

## 2022-07-23 DIAGNOSIS — E119 Type 2 diabetes mellitus without complications: Secondary | ICD-10-CM | POA: Diagnosis not present

## 2022-07-23 DIAGNOSIS — E559 Vitamin D deficiency, unspecified: Secondary | ICD-10-CM | POA: Diagnosis not present

## 2022-07-23 DIAGNOSIS — Z125 Encounter for screening for malignant neoplasm of prostate: Secondary | ICD-10-CM | POA: Diagnosis not present

## 2022-07-23 DIAGNOSIS — F319 Bipolar disorder, unspecified: Secondary | ICD-10-CM | POA: Diagnosis not present

## 2022-08-27 DIAGNOSIS — F319 Bipolar disorder, unspecified: Secondary | ICD-10-CM | POA: Diagnosis not present

## 2022-08-27 DIAGNOSIS — E119 Type 2 diabetes mellitus without complications: Secondary | ICD-10-CM | POA: Diagnosis not present

## 2022-09-02 DIAGNOSIS — F3162 Bipolar disorder, current episode mixed, moderate: Secondary | ICD-10-CM | POA: Diagnosis not present

## 2022-09-02 DIAGNOSIS — F4310 Post-traumatic stress disorder, unspecified: Secondary | ICD-10-CM | POA: Diagnosis not present

## 2022-09-02 DIAGNOSIS — F411 Generalized anxiety disorder: Secondary | ICD-10-CM | POA: Diagnosis not present

## 2022-10-02 DIAGNOSIS — F411 Generalized anxiety disorder: Secondary | ICD-10-CM | POA: Diagnosis not present

## 2022-10-02 DIAGNOSIS — F3162 Bipolar disorder, current episode mixed, moderate: Secondary | ICD-10-CM | POA: Diagnosis not present

## 2022-10-02 DIAGNOSIS — F4310 Post-traumatic stress disorder, unspecified: Secondary | ICD-10-CM | POA: Diagnosis not present

## 2022-11-26 ENCOUNTER — Ambulatory Visit
Admission: RE | Admit: 2022-11-26 | Discharge: 2022-11-26 | Disposition: A | Discharge status: Home / Self Care | Payer: Medicaid Other | Source: Ambulatory Visit

## 2022-11-26 VITALS — BP 133/84 | HR 87 | Temp 98.0°F | Resp 16

## 2022-11-26 DIAGNOSIS — L84 Corns and callosities: Secondary | ICD-10-CM | POA: Diagnosis not present

## 2022-11-26 MED ORDER — CEPHALEXIN 500 MG PO CAPS: 500.0000 mg | ORAL_CAPSULE | Freq: Three times a day (TID) | ORAL | 0 refills | Status: AC

## 2022-11-26 NOTE — ED Triage Notes (Signed)
Pt reports left foot pain.States he is a diabetic and has had a callus on his little toe for years but now the pain has gotten worse and hurts to put a shoe on. Has an appointment with a podiatrist next Thursday at 4 pm.

## 2022-11-26 NOTE — Discharge Instructions (Addendum)
I have prescribed you an antibiotic given concern of infection of the callus.  Please read attached instructions and follow-up with podiatry.  Wear well-fitting shoes as well.

## 2022-11-26 NOTE — ED Provider Notes (Signed)
[facility]    CSN: [id number] Arrival date & time: 11/26/22  1527      History   Chief Complaint Chief Complaint  Patient presents with   Foot Pain    HPI Seth Hood is a 57 y.o. male.   Patient presents with left foot pain due to a callus.  States that he has had a callus for multiple years but has become more painful recently.  Denies any drainage or increased welling.  Denies any associated fever.  He reports that he has an appointment with podiatry in 1 week.   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 No pertinent surgical history.     Home Medications    Prior to Admission medications   Medication Sig Start Date End Date Taking? Authorizing Provider  cephALEXin (KEFLEX) 500 MG capsule Take 1 capsule (500 mg total) by mouth 3 (three) times daily for 5 days. 11/26/22 12/01/22 Yes Callahan Peddie, Acie Fredrickson, FNP  losartan (COZAAR) 25 MG tablet Take 25 mg by mouth daily. 11/05/22  Yes [provider]  naltrexone (DEPADE) 50 MG tablet Take 50 mg by mouth daily. 11/17/22  Yes [provider]  oxcarbazepine (TRILEPTAL) 600 MG tablet Take 600 mg by mouth 2 (two) times daily. 11/17/22  Yes [provider]  OZEMPIC, 2 MG/DOSE, 8 MG/3ML SOPN Inject 1 mg into the skin once a week. 11/05/22  Yes [provider]  QUEtiapine (SEROQUEL) 50 MG tablet Take 50 mg by mouth at bedtime. 11/17/22  Yes [provider]  Vitamin D, Ergocalciferol, (DRISDOL) 1.25 MG (50000 UNIT) CAPS capsule Take 50,000 Units by mouth once a week. 11/05/22  Yes [provider]  hydrOXYzine (ATARAX) 25 MG tablet Take 1 tablet (25 mg total) by mouth 3 (three) times daily as needed for anxiety. 06/01/22   Oneta Rack, NP  metFORMIN (GLUCOPHAGE) 500 MG tablet Take 500 mg by mouth 2 (two) times daily with a meal.    [provider]  traZODone (DESYREL) 50 MG tablet Take 1 tablet (50 mg total) by mouth at bedtime as needed for sleep. 06/27/22   Lenard Lance, FNP    Family History History reviewed. No pertinent family history.  Social History Social History   Tobacco Use   Smoking status: Light Smoker    Types: Cigarettes  Substance Use Topics   Alcohol use: Yes    Comment: 2 cans of beer today   Drug use: No     Allergies   Shellfish allergy   Review of Systems Review of Systems Per HPI  Physical Exam Triage Vital Signs ED Triage Vitals  Encounter Vitals Group     BP 11/26/22 1543 133/84     Systolic BP Percentile --      Diastolic BP Percentile --      Pulse Rate 11/26/22 1543 87     Resp 11/26/22 1543 16     Temp 11/26/22 1543 98 F (36.7 C)     Temp Source 11/26/22 1543 Oral     SpO2 11/26/22 1543 93 %     Weight --      Height --      Head Circumference --  Peak Flow --      Pain Score 11/26/22 1540 0     Pain Loc --      Pain Education --      Exclude from Growth Chart --    No data found.  Updated Vital Signs BP 133/84 (BP Location: Left Arm)   Pulse 87   Temp 98 F (36.7 C) (Oral)   Resp 16   SpO2 93%   Visual Acuity Right Eye Distance:   Left Eye Distance:   Bilateral Distance:    Right Eye Near:   Left Eye Near:    Bilateral Near:     Physical Exam Constitutional:      General: He is not in acute distress.    Appearance: Normal appearance. He is not toxic-appearing or diaphoretic.  HENT:     Head: Normocephalic and atraumatic.  Eyes:     Extraocular Movements: Extraocular movements intact.     Conjunctiva/sclera: Conjunctivae normal.  Pulmonary:     Effort: Pulmonary effort is normal.  Feet:     Comments: Patient has approximately 1.5 to 2 cm in diameter callus present to left fifth toe at the lateral surface.  There is  very mild erythema surrounding but no significant swelling.  Capillary refill and pulses intact.  She has full range of motion of toes. No open wound.  Neurological:     General: No focal deficit present.     Mental Status: He is alert and oriented to person, place, and time. Mental status is at baseline.  Psychiatric:        Mood and Affect: Mood normal.        Behavior: Behavior normal.        Thought Content: Thought content normal.        Judgment: Judgment normal.      UC Treatments / Results  Labs (all labs ordered are listed, but only abnormal results are displayed) Labs Reviewed - No data to display  EKG   Radiology No results found.  Procedures Procedures (including critical care time)  Medications Ordered in UC Medications - No data to display  Initial Impression / Assessment and Plan / UC Course  I have reviewed the triage vital signs and the nursing notes.  Pertinent labs & imaging results that were available during my care of the patient were reviewed by me and considered in my medical decision making (see chart for details).     Patient has callus to left toe.  Physical exam is not convincing for infection but there is some mild erythema and patient is reporting increased pain so will cover for infection with cephalexin antibiotic.  Creatinine clearance is normal so no dosage adjustment necessary.  No indications for any septic joint on exam that would warrant imaging or emergent evaluation.  Advised patient to wear well-fitting shoes and to monitor closely.  Encouraged to keep scheduled appointment with podiatry for follow-up.  Patient verbalized understanding and was agreeable with plan. Final Clinical Impressions(s) / UC Diagnoses   Final diagnoses:  Callus of toe     Discharge Instructions      I have prescribed you an antibiotic given concern of infection of the callus.  Please read attached instructions and follow-up with podiatry.  Wear  well-fitting shoes as well.    ED Prescriptions     Medication Sig Dispense Auth. Provider   cephALEXin (KEFLEX) 500 MG capsule Take 1 capsule (500 mg total) by mouth 3 (three) times daily for 5  days. 15 capsule Slick, Acie Fredrickson, [place]      PDMP not reviewed this encounter.   Gustavus Bryant, [place] 11/26/22 [phone]

## 2022-12-03 ENCOUNTER — Encounter: Payer: Self-pay | Admitting: Podiatry

## 2022-12-03 ENCOUNTER — Ambulatory Visit (INDEPENDENT_AMBULATORY_CARE_PROVIDER_SITE_OTHER): Payer: Medicaid Other | Admitting: Podiatry

## 2022-12-03 DIAGNOSIS — M79674 Pain in right toe(s): Secondary | ICD-10-CM | POA: Diagnosis not present

## 2022-12-03 DIAGNOSIS — E1142 Type 2 diabetes mellitus with diabetic polyneuropathy: Secondary | ICD-10-CM | POA: Diagnosis not present

## 2022-12-03 DIAGNOSIS — Q828 Other specified congenital malformations of skin: Secondary | ICD-10-CM | POA: Diagnosis not present

## 2022-12-03 DIAGNOSIS — B351 Tinea unguium: Secondary | ICD-10-CM

## 2022-12-03 DIAGNOSIS — M79675 Pain in left toe(s): Secondary | ICD-10-CM

## 2022-12-03 MED ORDER — UREA 10 % EX CREA: TOPICAL_CREAM | CUTANEOUS | 0 refills | Status: AC | PRN

## 2022-12-03 NOTE — Progress Notes (Signed)
  Subjective:  Patient ID: Seth Hood, male    DOB: [birth year],  MRN: [id number]  Chief Complaint  Patient presents with   Callouses    Corn to left 5th toe- painful. Diabetic. Latest A1C 6.0 (4 months ago)    57 y.o. male presents with concern for corn to the lateral aspect of left fifth toe.  Says its painful.  Denies any drainage from the area.  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  Last A1c was about 6 4 months ago.  He also notes thickening dystrophy and abnormal discoloration of the hallux nail bilaterally  Past Medical History:  Diagnosis Date   Diabetes mellitus without complication (HCC)    Hypertension     Allergies  Allergen Reactions   Shellfish Allergy Rash    ROS: Negative except as per HPI above  Objective:  General: AAO x3, NAD  Dermatological: Hyperkeratotic lesion with central hyperkeratotic core at the lateral dorsal aspect of the fifth toe on the left foot.  Consistent with porokeratosis with pain on palpation.  Also with thickening black discoloration and dystrophy of the bilateral hallux nail.  Vascular:  Dorsalis Pedis artery and Posterior Tibial artery pedal pulses are 2/4 bilateral.  Capillary fill time < 3 sec to all digits.   Neruologic: Grossly diminished via light touch bilateral foot  Musculoskeletal: No gross boney pedal deformities bilateral. No pain, crepitus, or limitation noted with foot and ankle range of motion bilateral. Muscular strength 5/5 in all groups tested bilateral.  Gait: Unassisted, Nonantalgic.   No images are attached to the encounter.   Assessment:   1. Porokeratosis   2. Pain due to onychomycosis of toenails of both feet   3. DM type 2 with diabetic peripheral neuropathy (HCC)      Plan:  Patient was evaluated and treated and all questions answered.  # Porokeratosis of fifth toe left foot -All symptomatic hyperkeratoses  x1 were safely debrided with a sterile #15 blade to patient's  level of comfort without incident. We discussed preventative and palliative care of these lesions including supportive and accommodative shoegear, padding, prefabricated and custom molded accommodative orthoses, use of a pumice stone and lotions/creams daily.  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 -Educated on self-care  Procedure: Nail Debridement Rationale: Pain Type of Debridement: manual, sharp debridement. Instrumentation: Nail nipper, rotary burr. Number of Nails: 2   Return in about 3 months (around 03/05/2023) for [facility].          Corinna Gab, DPM [facility] / [facility]

## 2023-01-06 DIAGNOSIS — F411 Generalized anxiety disorder: Secondary | ICD-10-CM | POA: Diagnosis not present

## 2023-01-06 DIAGNOSIS — F3162 Bipolar disorder, current episode mixed, moderate: Secondary | ICD-10-CM | POA: Diagnosis not present

## 2023-01-06 DIAGNOSIS — F4310 Post-traumatic stress disorder, unspecified: Secondary | ICD-10-CM | POA: Diagnosis not present

## 2023-01-12 DIAGNOSIS — E119 Type 2 diabetes mellitus without complications: Secondary | ICD-10-CM | POA: Diagnosis not present

## 2023-01-12 DIAGNOSIS — Z23 Encounter for immunization: Secondary | ICD-10-CM | POA: Diagnosis not present

## 2023-01-12 DIAGNOSIS — F319 Bipolar disorder, unspecified: Secondary | ICD-10-CM | POA: Diagnosis not present

## 2023-03-04 ENCOUNTER — Ambulatory Visit (INDEPENDENT_AMBULATORY_CARE_PROVIDER_SITE_OTHER): Payer: Medicaid Other | Admitting: Podiatry

## 2023-03-04 DIAGNOSIS — Z91199 Patient's noncompliance with other medical treatment and regimen due to unspecified reason: Secondary | ICD-10-CM

## 2023-03-04 NOTE — Progress Notes (Signed)
 Patient absent for apointment

## 2023-05-28 DIAGNOSIS — F4310 Post-traumatic stress disorder, unspecified: Secondary | ICD-10-CM | POA: Diagnosis not present

## 2023-05-28 DIAGNOSIS — F3162 Bipolar disorder, current episode mixed, moderate: Secondary | ICD-10-CM | POA: Diagnosis not present

## 2023-05-28 DIAGNOSIS — F411 Generalized anxiety disorder: Secondary | ICD-10-CM | POA: Diagnosis not present

## 2023-07-02 DIAGNOSIS — F3162 Bipolar disorder, current episode mixed, moderate: Secondary | ICD-10-CM | POA: Diagnosis not present

## 2023-07-02 DIAGNOSIS — F4310 Post-traumatic stress disorder, unspecified: Secondary | ICD-10-CM | POA: Diagnosis not present

## 2023-07-02 DIAGNOSIS — F411 Generalized anxiety disorder: Secondary | ICD-10-CM | POA: Diagnosis not present

## 2023-08-11 ENCOUNTER — Emergency Department (HOSPITAL_COMMUNITY): Admission: EM | Admit: 2023-08-11 | Discharge: 2023-08-11 | Disposition: A | Discharge status: Home / Self Care

## 2023-08-11 ENCOUNTER — Encounter (HOSPITAL_COMMUNITY): Payer: Self-pay

## 2023-08-11 ENCOUNTER — Other Ambulatory Visit: Payer: Self-pay

## 2023-08-11 DIAGNOSIS — Z5321 Procedure and treatment not carried out due to patient leaving prior to being seen by health care provider: Secondary | ICD-10-CM | POA: Insufficient documentation

## 2023-08-11 DIAGNOSIS — E162 Hypoglycemia, unspecified: Secondary | ICD-10-CM | POA: Insufficient documentation

## 2023-08-11 LAB — CBG MONITORING, ED: Glucose-Capillary: 78 mg/dL (ref 70–99)

## 2023-08-11 NOTE — ED Notes (Signed)
 Unable to obtain labs

## 2023-08-11 NOTE — ED Notes (Signed)
Pt can't urinate at this time.

## 2023-08-11 NOTE — ED Notes (Signed)
 Pt seen by staff leaving the ER

## 2023-08-11 NOTE — ED Triage Notes (Signed)
 Pt c/o low energyx5-7d. Pt states he feel like his blood sugar has been low, but he hasn't been checking his blood sugar. 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

## 2023-09-22 DIAGNOSIS — E119 Type 2 diabetes mellitus without complications: Secondary | ICD-10-CM | POA: Diagnosis not present

## 2023-10-26 DIAGNOSIS — F411 Generalized anxiety disorder: Secondary | ICD-10-CM | POA: Diagnosis not present

## 2023-10-26 DIAGNOSIS — F4310 Post-traumatic stress disorder, unspecified: Secondary | ICD-10-CM | POA: Diagnosis not present

## 2023-10-26 DIAGNOSIS — F3162 Bipolar disorder, current episode mixed, moderate: Secondary | ICD-10-CM | POA: Diagnosis not present

## 2023-11-18 DIAGNOSIS — F319 Bipolar disorder, unspecified: Secondary | ICD-10-CM | POA: Diagnosis not present

## 2023-11-18 DIAGNOSIS — Z125 Encounter for screening for malignant neoplasm of prostate: Secondary | ICD-10-CM | POA: Diagnosis not present

## 2023-11-18 DIAGNOSIS — Z0000 Encounter for general adult medical examination without abnormal findings: Secondary | ICD-10-CM | POA: Diagnosis not present

## 2023-11-18 DIAGNOSIS — E119 Type 2 diabetes mellitus without complications: Secondary | ICD-10-CM | POA: Diagnosis not present

## 2023-11-18 DIAGNOSIS — E559 Vitamin D deficiency, unspecified: Secondary | ICD-10-CM | POA: Diagnosis not present

## 2023-12-20 DIAGNOSIS — F4310 Post-traumatic stress disorder, unspecified: Secondary | ICD-10-CM | POA: Diagnosis not present

## 2023-12-20 DIAGNOSIS — F3162 Bipolar disorder, current episode mixed, moderate: Secondary | ICD-10-CM | POA: Diagnosis not present

## 2023-12-20 DIAGNOSIS — F411 Generalized anxiety disorder: Secondary | ICD-10-CM | POA: Diagnosis not present

## 2024-02-01 DIAGNOSIS — F4310 Post-traumatic stress disorder, unspecified: Secondary | ICD-10-CM | POA: Diagnosis not present

## 2024-02-01 DIAGNOSIS — F3162 Bipolar disorder, current episode mixed, moderate: Secondary | ICD-10-CM | POA: Diagnosis not present

## 2024-02-01 DIAGNOSIS — F411 Generalized anxiety disorder: Secondary | ICD-10-CM | POA: Diagnosis not present
